# Patient Record
Sex: Female | Born: 2000 | Race: Black or African American | Hispanic: No | Marital: Single
Health system: Southern US, Community
[De-identification: ages and names within clinical notes are randomized; demographics above are authoritative.]

---
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
## 2023-10-16 ENCOUNTER — Other Ambulatory Visit: Payer: Self-pay

## 2023-10-16 ENCOUNTER — Encounter (HOSPITAL_BASED_OUTPATIENT_CLINIC_OR_DEPARTMENT_OTHER): Payer: Self-pay

## 2023-10-16 ENCOUNTER — Emergency Department (HOSPITAL_BASED_OUTPATIENT_CLINIC_OR_DEPARTMENT_OTHER)
Admission: EM | Admit: 2023-10-16 | Discharge: 2023-10-16 | Disposition: A | Discharge status: Home / Self Care | Attending: Emergency Medicine | Admitting: Emergency Medicine

## 2023-10-16 ENCOUNTER — Emergency Department (HOSPITAL_BASED_OUTPATIENT_CLINIC_OR_DEPARTMENT_OTHER)

## 2023-10-16 DIAGNOSIS — R112 Nausea with vomiting, unspecified: Secondary | ICD-10-CM | POA: Insufficient documentation

## 2023-10-16 DIAGNOSIS — R000 Tachycardia, unspecified: Secondary | ICD-10-CM | POA: Insufficient documentation

## 2023-10-16 DIAGNOSIS — R55 Syncope and collapse: Secondary | ICD-10-CM | POA: Diagnosis present

## 2023-10-16 LAB — CBC WITH DIFFERENTIAL/PLATELET
Abs Immature Granulocytes: 0.03 K/uL (ref 0.00–0.07)
Basophils Absolute: 0 K/uL (ref 0.0–0.1)
Basophils Relative: 0 %
Eosinophils Absolute: 0.1 K/uL (ref 0.0–0.5)
Eosinophils Relative: 1 %
HCT: 37.1 % (ref 36.0–46.0)
Hemoglobin: 12.4 g/dL (ref 12.0–15.0)
Immature Granulocytes: 0 %
Lymphocytes Relative: 21 %
Lymphs Abs: 1.9 K/uL (ref 0.7–4.0)
MCH: 28.2 pg (ref 26.0–34.0)
MCHC: 33.4 g/dL (ref 30.0–36.0)
MCV: 84.5 fL (ref 80.0–100.0)
Monocytes Absolute: 0.8 K/uL (ref 0.1–1.0)
Monocytes Relative: 8 %
Neutro Abs: 6.6 K/uL (ref 1.7–7.7)
Neutrophils Relative %: 70 %
Platelets: 273 K/uL (ref 150–400)
RBC: 4.39 MIL/uL (ref 3.87–5.11)
RDW: 12.3 % (ref 11.5–15.5)
WBC: 9.3 K/uL (ref 4.0–10.5)
nRBC: 0 % (ref 0.0–0.2)

## 2023-10-16 LAB — COMPREHENSIVE METABOLIC PANEL WITH GFR
ALT: 5 U/L (ref 0–44)
AST: 14 U/L — ABNORMAL LOW (ref 15–41)
Albumin: 4.2 g/dL (ref 3.5–5.0)
Alkaline Phosphatase: 57 U/L (ref 38–126)
Anion gap: 12 (ref 5–15)
BUN: 8 mg/dL (ref 6–20)
CO2: 23 mmol/L (ref 22–32)
Calcium: 9.3 mg/dL (ref 8.9–10.3)
Chloride: 104 mmol/L (ref 98–111)
Creatinine, Ser: 0.92 mg/dL (ref 0.44–1.00)
GFR, Estimated: >60 mL/min (ref >60)
Glucose, Bld: 121 mg/dL — ABNORMAL HIGH (ref 70–99)
Potassium: 3.5 mmol/L (ref 3.5–5.1)
Sodium: 139 mmol/L (ref 135–145)
Total Bilirubin: 0.4 mg/dL (ref 0.0–1.2)
Total Protein: 6.5 g/dL (ref 6.5–8.1)

## 2023-10-16 LAB — WET PREP, GENITAL
Clue Cells Wet Prep HPF POC: NONE SEEN
Sperm: NONE SEEN
Trich, Wet Prep: NONE SEEN
WBC, Wet Prep HPF POC: >=10 — AB (ref <10)
Yeast Wet Prep HPF POC: NONE SEEN

## 2023-10-16 LAB — PREGNANCY, URINE: Preg Test, Ur: NEGATIVE

## 2023-10-16 LAB — URINALYSIS, W/ REFLEX TO CULTURE (INFECTION SUSPECTED)
Bacteria, UA: NONE SEEN
Bilirubin Urine: NEGATIVE
Glucose, UA: NEGATIVE mg/dL
Hgb urine dipstick: NEGATIVE
Ketones, ur: NEGATIVE mg/dL
Leukocytes,Ua: NEGATIVE
Nitrite: NEGATIVE
Protein, ur: NEGATIVE mg/dL
Specific Gravity, Urine: 1.014 (ref 1.005–1.030)
pH: 8 (ref 5.0–8.0)

## 2023-10-16 LAB — HCG, QUANTITATIVE, PREGNANCY: hCG, Beta Chain, Quant, S: <1 m[IU]/mL (ref <5)

## 2023-10-16 LAB — LIPASE, BLOOD: Lipase: 68 U/L — ABNORMAL HIGH (ref 11–51)

## 2023-10-16 MED ORDER — IOHEXOL 300 MG/ML  SOLN
80.0000 mL | Freq: Once | INTRAMUSCULAR | Status: AC | PRN
  Administered 2023-10-16: 80 mL via INTRAVENOUS

## 2023-10-16 MED ORDER — SODIUM CHLORIDE 0.9 % IV BOLUS
1000.0000 mL | Freq: Once | INTRAVENOUS | Status: AC
  Administered 2023-10-16: 1000 mL via INTRAVENOUS

## 2023-10-16 NOTE — Discharge Instructions (Addendum)
 Make sure that you are staying well-hydrated.  Your lipase was mildly elevated as well as your glucose.  Make a follow-up appoint with your primary care doctor within the next 2 to 3 days.  They can recheck these labs at some point to make sure they have normalized.  Return to the emergency room if you have any worsening symptoms.

## 2023-10-16 NOTE — ED Provider Notes (Signed)
 [place] EMERGENCY DEPARTMENT AT [facility] Provider Note   CSN: [id number] Arrival date & time: 10/16/23  1505     Patient presents with: Loss of Consciousness   Debbie Hansen is a 23 y.o. female.   Patient is a 23 year old female with no significant past medical history who presents after a syncopal episode.  She said that she has some concerns that she may be pregnant.  Her last normal menstrual cycle was August 2.  She said this month, her menstrual cycle was 2 days late and it was very light, only lasting about 2 days and was more spotting than actual bleeding.  She also has some nausea and some breast tenderness.  She has never been pregnant before.  She said that she got nauseated today and was going to the bathroom to vomit and turned around quickly and got dizzy and had a brief syncopal episode.  She said she is not sure if she completely lost consciousness but she said maybe for second.  She did fall but she denies any injuries from the fall other than she has a little scrape on the side of her face.  No neck or back pain.  She denies any urinary symptoms.  She has some cramping in the mid abdominal area.  She denies any palpitations.  No chest pain or shortness of breath.  She was having some vaginal discharge after the brief spotting earlier this month but says that is pretty much cleared up.  She denies any pelvic pain.  No fevers.  She has a little bit of sinus congestion that she has had for the last few days but says that those symptoms have improved.  No cough or chest congestion.  No fevers.       Prior to Admission medications   Medication Sig Start Date End Date Taking? Authorizing Provider  polyethylene glycol (MIRALAX ) packet Take 17 g by mouth daily. 11/02/17   Mabe, [place] CROME, MD    Allergies: Patient has no known allergies.    Review of Systems  Constitutional:  Positive for fatigue. Negative for chills, diaphoresis and fever.  HENT:  Positive for  congestion and rhinorrhea. Negative for sneezing.   Eyes: Negative.   Respiratory:  Negative for cough, chest tightness and shortness of breath.   Cardiovascular:  Negative for chest pain and leg swelling.  Gastrointestinal:  Positive for abdominal pain, nausea and vomiting. Negative for blood in stool and diarrhea.  Genitourinary:  Positive for vaginal discharge (Earlier this month although seems to be improving). Negative for difficulty urinating, flank pain, frequency, vaginal bleeding and vaginal pain.  Musculoskeletal:  Negative for arthralgias and back pain.  Skin:  Positive for wound. Negative for rash.  Neurological:  Negative for dizziness, speech difficulty, weakness, numbness and headaches.    Updated Vital Signs BP 114/73 (BP Location: Right Arm)   Pulse 84   Temp 98.2 F (36.8 C)   Resp 18   LMP 10/02/2023 (Approximate)   SpO2 98%   Physical Exam Constitutional:      Appearance: She is well-developed.  HENT:     Head: Normocephalic.     Comments: Small abrasion just lateral to the right eyebrow, no bony tenderness to the face Eyes:     Pupils: Pupils are equal, round, and reactive to light.  Cardiovascular:     Rate and Rhythm: Regular rhythm. Tachycardia present.     Heart sounds: Normal heart sounds.  Pulmonary:     Effort: Pulmonary effort  is normal. No respiratory distress.     Breath sounds: Normal breath sounds. No wheezing or rales.  Chest:     Chest wall: No tenderness.  Abdominal:     General: Bowel sounds are normal.     Palpations: Abdomen is soft.     Tenderness: There is no abdominal tenderness. There is no guarding or rebound.  Musculoskeletal:        General: Normal range of motion.     Cervical back: Normal range of motion and neck supple.     Comments: No edema or calf tenderness  Lymphadenopathy:     Cervical: No cervical adenopathy.  Skin:    General: Skin is warm and dry.     Findings: No rash.  Neurological:     General: No focal  deficit present.     Mental Status: She is alert and oriented to person, place, and time.     (all labs ordered are listed, but only abnormal results are displayed) Labs Reviewed  WET PREP, GENITAL - Abnormal; Notable for the following components:      Result Value   WBC, Wet Prep HPF POC >=10 (*)    All other components within normal limits  COMPREHENSIVE METABOLIC PANEL WITH GFR - Abnormal; Notable for the following components:   Glucose, Bld 121 (*)    AST 14 (*)    All other components within normal limits  LIPASE, BLOOD - Abnormal; Notable for the following components:   Lipase 68 (*)    All other components within normal limits  PREGNANCY, URINE  URINALYSIS, W/ REFLEX TO CULTURE (INFECTION SUSPECTED)  HCG, QUANTITATIVE, PREGNANCY  CBC WITH DIFFERENTIAL/PLATELET  GC/CHLAMYDIA PROBE AMP ([place]) NOT AT [facility]    EKG: EKG Interpretation Date/Time:  Sunday October 16 2023 15:40:45 EDT Ventricular Rate:  115 PR Interval:  126 QRS Duration:  97 QT Interval:  305 QTC Calculation: 422 R Axis:   52  Text Interpretation: Sinus tachycardia SINCE LAST TRACING HEART RATE HAS INCREASED Confirmed by Lenor Hollering [phone]) on 10/16/2023 3:42:55 PM  Radiology: US  PELVIC COMPLETE W TRANSVAGINAL AND TORSION R/O Result Date: 10/16/2023 EXAM: US  Pelvis, Complete Transvaginal and Transabdominal with Doppler TECHNIQUE: Transabdominal and transvaginal pelvic duplex ultrasound using B-mode/gray scaled imaging with Doppler spectral analysis and color flow was obtained. COMPARISON: CT abdomen / pelvis earlier today CLINICAL HISTORY: [id number] RLQ abdominal pain 151439. C/o pelvic cramps x 1 month, RLQ pain today.; Patient refused chaperone and signed waiver. C/o pelvic cramps x 1 month, RLQ pain today. FINDINGS: UTERUS: Uterus measures 6.5 x 3.4 x 4.8 cm (56 ml). Uterus demonstrates arcuate configuration. ENDOMETRIAL STRIPE: Endometrial complex measures 9 mm. RIGHT OVARY: Right ovary measures 2.5  x 2.1 x 2.0 cm (5.6 ml). Right ovary is within normal limits. There is normal arterial and venous Doppler flow. LEFT OVARY: Left ovary measures 2.5 x 1.7 x 1.8 cm (4.2 ml) with an involuting corpus luteum. Left ovary is within normal limits. There is normal arterial and venous Doppler flow. FREE FLUID: Small volume pelvic ascites, physiologic. IMPRESSION: 1. No acute abnormalities. 2. No evidence of ovarian torsion. Electronically signed by: Pinkie Pebbles MD 10/16/2023 07:31 PM EDT RP Workstation: [id number]   CT ABDOMEN PELVIS W CONTRAST Result Date: 10/16/2023 CLINICAL DATA:  Right lower quadrant abdominal pain. EXAM: CT ABDOMEN AND PELVIS WITH CONTRAST TECHNIQUE: Multidetector CT imaging of the abdomen and pelvis was performed using the standard protocol following bolus administration of intravenous contrast. RADIATION DOSE REDUCTION: This exam was  performed according to the departmental dose-optimization program which includes automated exposure control, adjustment of the mA and/or kV according to patient size and/or use of iterative reconstruction technique. CONTRAST:  80mL OMNIPAQUE  IOHEXOL  300 MG/ML  SOLN COMPARISON:  10/31/2017. FINDINGS: Lower chest: No acute abnormality. Hepatobiliary: No focal liver abnormality is seen. No gallstones, gallbladder wall thickening, or biliary dilatation. Pancreas: Unremarkable. No pancreatic ductal dilatation or surrounding inflammatory changes. Spleen: Normal in size without focal abnormality. Adrenals/Urinary Tract: The adrenal glands are within normal limits. The kidneys enhance symmetrically. Hypodensities are present in the left kidney, likely cysts. Excreted contrast is noted at the renal pyramids on the right. No renal calculus or hydronephrosis bilaterally. The bladder is unremarkable. Stomach/Bowel: The stomach is within normal limits. No bowel obstruction, free air, or pneumatosis is seen. The visualized portion of the appendix appears normal, axial image  58. Vascular/Lymphatic: No significant vascular findings are present. No enlarged abdominal or pelvic lymph nodes. Reproductive: The uterus is within normal limits. No adnexal mass is seen. Other: A small to moderate amount of free fluid is present in the cul-de-sac and right adnexa. There is a small fat containing umbilical hernia. Musculoskeletal: No acute osseous abnormality. IMPRESSION: 1. Visualized appendix appears normal. 2. Small to moderate amount of free fluid in the pelvis. Consider ultrasound for further evaluation. Electronically Signed   By: Leita Birmingham M.D.   On: 10/16/2023 18:11     Procedures   Medications Ordered in the ED  sodium chloride  0.9 % bolus 1,000 mL (0 mLs Intravenous Stopped 10/16/23 1752)  sodium chloride  0.9 % bolus 1,000 mL (0 mLs Intravenous Stopped 10/16/23 1938)  iohexol  (OMNIPAQUE ) 300 MG/ML solution 80 mL (80 mLs Intravenous Contrast Given 10/16/23 1754)                                    Medical Decision Making Amount and/or Complexity of Data Reviewed Labs: ordered. Radiology: ordered.  Risk Prescription drug management.   This patient presents to the ED for concern of syncope, this involves an extensive number of treatment options, and is a complaint that carries with it a high risk of complications and morbidity.  I considered the following differential and admission for this acute, potentially life threatening condition.  The differential diagnosis includes arrhythmia, dehydration, ectopic pregnancy, anemia, electrolyte abnormality  MDM:    Patient is a 23 year old who presents after she had a syncopal episode.  She has had some vague symptoms over the last few weeks of some nausea and breast tenderness with an irregular menstrual cycle.  She has some mild tenderness to her abdomen initially but nonconcerning exam.  She was markedly tachycardic on arrival.  She was started on IV fluids.  Her labs are nonconcerning.  Her hemoglobin is normal.  Her  glucose is minimally elevated but no signs of DKA.  Her lipase is mildly elevated.  On reexam, she was still a bit tachycardic and she had some tenderness to her right lower abdomen.  For this reason a CT scan was performed.  Her appendix looks normal.  There was some fluid in the pelvis and pelvic ultrasound was recommended.  Ultrasound was performed which showed no acute abnormality.  No evidence of ovarian torsion or cyst.  Her repeat abdominal exam is benign.  Her heart rate is normalized after second liter of fluid.  She has been able to ambulate without dizziness or ongoing symptoms.  Her EKG did not show any arrhythmias.  She was discharged home in good condition.  Was encouraged to follow-up with her PCP within the next few days.  Return precautions were given.  (Labs, imaging, consults)  Labs: I Ordered, and personally interpreted labs.  The pertinent results include: No anemia, electrolytes okay, negative pregnancy test  Imaging Studies ordered: I ordered imaging studies including CT abdomen pelvis, pelvic ultrasound I independently visualized and interpreted imaging. I agree with the radiologist interpretation  Additional history obtained from chart.  External records from outside source obtained and reviewed including prior notes  Cardiac Monitoring: The patient was maintained on a cardiac monitor.  If on the cardiac monitor, I personally viewed and interpreted the cardiac monitored which showed an underlying rhythm of: Sinus tachycardia  Reevaluation: After the interventions noted above, I reevaluated the patient and found that they have :improved  Social Determinants of Health:    Disposition: Discharged to home  Co morbidities that complicate the patient evaluation History reviewed. No pertinent past medical history.   Medicines Meds ordered this encounter  Medications   sodium chloride  0.9 % bolus 1,000 mL   sodium chloride  0.9 % bolus 1,000 mL   iohexol  (OMNIPAQUE )  300 MG/ML solution 80 mL    I have reviewed the patients home medicines and have made adjustments as needed  Problem List / ED Course: Problem List Items Addressed This Visit   None Visit Diagnoses       Syncope, unspecified syncope type    -  Primary                Final diagnoses:  Syncope, unspecified syncope type    ED Discharge Orders     None          Lenor Hollering, MD 10/16/23 [id number]

## 2023-10-16 NOTE — ED Triage Notes (Signed)
 Pt c/o crazy symptoms onset last month, c/o breast tenderness, cramping, egg-white discharge, urinary frequency. Advises period was 2 days late, it was a light period, bled for 2 full days & the rest was spotting. Advises syncopal episode earlier today, I felt nauseous, when I tried to go to the bathroom, it was occupied, then I turned around too fast, hit my head & fell out.   Advises resolution of symptoms at time of discharged

## 2023-10-17 LAB — GC/CHLAMYDIA PROBE AMP (~~LOC~~) NOT AT ARMC
Chlamydia: NEGATIVE
Comment: NEGATIVE
Comment: NORMAL
Neisseria Gonorrhea: NEGATIVE
